# Patient Record
Sex: Male | Born: 1945 | Race: White | Hispanic: No | Marital: Married | Smoking: Former smoker
Health system: Southern US, Community
[De-identification: ages and names within clinical notes are randomized; demographics above are authoritative.]

## PROBLEM LIST (undated history)

## (undated) DIAGNOSIS — M1990 Unspecified osteoarthritis, unspecified site: Secondary | ICD-10-CM

## (undated) DIAGNOSIS — R7303 Prediabetes: Secondary | ICD-10-CM

## (undated) DIAGNOSIS — I2510 Atherosclerotic heart disease of native coronary artery without angina pectoris: Secondary | ICD-10-CM

## (undated) DIAGNOSIS — F32A Depression, unspecified: Secondary | ICD-10-CM

## (undated) DIAGNOSIS — F329 Major depressive disorder, single episode, unspecified: Secondary | ICD-10-CM

## (undated) DIAGNOSIS — K219 Gastro-esophageal reflux disease without esophagitis: Secondary | ICD-10-CM

## (undated) DIAGNOSIS — M797 Fibromyalgia: Secondary | ICD-10-CM

## (undated) DIAGNOSIS — C801 Malignant (primary) neoplasm, unspecified: Secondary | ICD-10-CM

## (undated) DIAGNOSIS — J189 Pneumonia, unspecified organism: Secondary | ICD-10-CM

## (undated) DIAGNOSIS — G4730 Sleep apnea, unspecified: Secondary | ICD-10-CM

## (undated) DIAGNOSIS — I10 Essential (primary) hypertension: Secondary | ICD-10-CM

## (undated) HISTORY — PX: BACK SURGERY: SHX140

## (undated) HISTORY — PX: APPENDECTOMY: SHX54

## (undated) HISTORY — PX: ANAL FISSURE REPAIR: SHX2312

## (undated) HISTORY — PX: TONSILLECTOMY: SUR1361

## (undated) HISTORY — PX: VASECTOMY: SHX75

---

## 2004-11-14 DIAGNOSIS — I2510 Atherosclerotic heart disease of native coronary artery without angina pectoris: Secondary | ICD-10-CM

## 2004-11-14 HISTORY — PX: CORONARY ANGIOPLASTY: SHX604

## 2004-11-14 HISTORY — DX: Atherosclerotic heart disease of native coronary artery without angina pectoris: I25.10

## 2017-03-09 ENCOUNTER — Other Ambulatory Visit (HOSPITAL_COMMUNITY): Payer: Self-pay | Admitting: Neurological Surgery

## 2017-03-09 DIAGNOSIS — M5416 Radiculopathy, lumbar region: Secondary | ICD-10-CM

## 2017-03-14 ENCOUNTER — Ambulatory Visit (HOSPITAL_COMMUNITY)
Admission: RE | Admit: 2017-03-14 | Discharge: 2017-03-14 | Disposition: A | Discharge status: Home / Self Care | Payer: Medicare Other | Source: Ambulatory Visit | Attending: Neurological Surgery | Admitting: Neurological Surgery

## 2017-03-14 DIAGNOSIS — M5127 Other intervertebral disc displacement, lumbosacral region: Secondary | ICD-10-CM | POA: Diagnosis not present

## 2017-03-14 DIAGNOSIS — M4807 Spinal stenosis, lumbosacral region: Secondary | ICD-10-CM | POA: Diagnosis not present

## 2017-03-14 DIAGNOSIS — M5136 Other intervertebral disc degeneration, lumbar region: Secondary | ICD-10-CM | POA: Diagnosis not present

## 2017-03-14 DIAGNOSIS — M5126 Other intervertebral disc displacement, lumbar region: Secondary | ICD-10-CM | POA: Insufficient documentation

## 2017-03-14 DIAGNOSIS — M5416 Radiculopathy, lumbar region: Secondary | ICD-10-CM | POA: Diagnosis not present

## 2017-03-14 DIAGNOSIS — M48061 Spinal stenosis, lumbar region without neurogenic claudication: Secondary | ICD-10-CM | POA: Insufficient documentation

## 2017-03-15 ENCOUNTER — Ambulatory Visit (HOSPITAL_COMMUNITY): Payer: Medicare Other

## 2017-12-15 ENCOUNTER — Other Ambulatory Visit: Payer: Self-pay

## 2017-12-15 ENCOUNTER — Encounter (HOSPITAL_COMMUNITY): Payer: Self-pay | Admitting: *Deleted

## 2017-12-15 NOTE — Progress Notes (Signed)
Spoke with pt for pre-op call. Pt has hx of CAD with one stent placed in 2006. Pt denies any other cardiac history, chest pain or sob. Dr. Rosalita Chessman in [place], [place] is pt's cardiologist. Pt states he has seen Dr. Rosalita Chessman this past year. Have requested most recent EKG tracing, last office visit notes and any cardiac studies. Pt denies being diabetic.

## 2017-12-18 ENCOUNTER — Ambulatory Visit: Payer: Self-pay | Admitting: Ophthalmology

## 2017-12-18 ENCOUNTER — Ambulatory Visit (HOSPITAL_COMMUNITY): Payer: Medicare Other | Admitting: Anesthesiology

## 2017-12-18 ENCOUNTER — Encounter (HOSPITAL_COMMUNITY)
Admission: RE | Disposition: A | Discharge status: Home / Self Care | Payer: Self-pay | Source: Ambulatory Visit | Attending: Ophthalmology

## 2017-12-18 ENCOUNTER — Encounter (HOSPITAL_COMMUNITY): Payer: Self-pay | Admitting: Anesthesiology

## 2017-12-18 ENCOUNTER — Ambulatory Visit (HOSPITAL_COMMUNITY)
Admission: RE | Admit: 2017-12-18 | Discharge: 2017-12-18 | Disposition: A | Discharge status: Home / Self Care | Payer: Medicare Other | Source: Ambulatory Visit | Attending: Ophthalmology | Admitting: Ophthalmology

## 2017-12-18 DIAGNOSIS — Z823 Family history of stroke: Secondary | ICD-10-CM | POA: Insufficient documentation

## 2017-12-18 DIAGNOSIS — M797 Fibromyalgia: Secondary | ICD-10-CM | POA: Insufficient documentation

## 2017-12-18 DIAGNOSIS — Z841 Family history of disorders of kidney and ureter: Secondary | ICD-10-CM | POA: Diagnosis not present

## 2017-12-18 DIAGNOSIS — M1990 Unspecified osteoarthritis, unspecified site: Secondary | ICD-10-CM | POA: Insufficient documentation

## 2017-12-18 DIAGNOSIS — G4730 Sleep apnea, unspecified: Secondary | ICD-10-CM | POA: Diagnosis not present

## 2017-12-18 DIAGNOSIS — K219 Gastro-esophageal reflux disease without esophagitis: Secondary | ICD-10-CM | POA: Insufficient documentation

## 2017-12-18 DIAGNOSIS — H35341 Macular cyst, hole, or pseudohole, right eye: Secondary | ICD-10-CM | POA: Insufficient documentation

## 2017-12-18 DIAGNOSIS — I4510 Unspecified right bundle-branch block: Secondary | ICD-10-CM | POA: Diagnosis not present

## 2017-12-18 DIAGNOSIS — Z87891 Personal history of nicotine dependence: Secondary | ICD-10-CM | POA: Insufficient documentation

## 2017-12-18 DIAGNOSIS — I2510 Atherosclerotic heart disease of native coronary artery without angina pectoris: Secondary | ICD-10-CM | POA: Insufficient documentation

## 2017-12-18 DIAGNOSIS — Z85828 Personal history of other malignant neoplasm of skin: Secondary | ICD-10-CM | POA: Insufficient documentation

## 2017-12-18 DIAGNOSIS — I10 Essential (primary) hypertension: Secondary | ICD-10-CM | POA: Diagnosis not present

## 2017-12-18 DIAGNOSIS — R7303 Prediabetes: Secondary | ICD-10-CM | POA: Insufficient documentation

## 2017-12-18 DIAGNOSIS — F329 Major depressive disorder, single episode, unspecified: Secondary | ICD-10-CM | POA: Diagnosis not present

## 2017-12-18 DIAGNOSIS — Z8249 Family history of ischemic heart disease and other diseases of the circulatory system: Secondary | ICD-10-CM | POA: Insufficient documentation

## 2017-12-18 HISTORY — DX: Pneumonia, unspecified organism: J18.9

## 2017-12-18 HISTORY — DX: Malignant (primary) neoplasm, unspecified: C80.1

## 2017-12-18 HISTORY — DX: Sleep apnea, unspecified: G47.30

## 2017-12-18 HISTORY — PX: SERUM PATCH: SHX6091

## 2017-12-18 HISTORY — DX: Atherosclerotic heart disease of native coronary artery without angina pectoris: I25.10

## 2017-12-18 HISTORY — PX: GAS INSERTION: SHX5336

## 2017-12-18 HISTORY — PX: MEMBRANE PEEL: SHX5967

## 2017-12-18 HISTORY — PX: PARS PLANA VITRECTOMY W/ SCLERAL BUCKLE: SHX2171

## 2017-12-18 HISTORY — DX: Major depressive disorder, single episode, unspecified: F32.9

## 2017-12-18 HISTORY — DX: Gastro-esophageal reflux disease without esophagitis: K21.9

## 2017-12-18 HISTORY — DX: Fibromyalgia: M79.7

## 2017-12-18 HISTORY — DX: Depression, unspecified: F32.A

## 2017-12-18 HISTORY — DX: Unspecified osteoarthritis, unspecified site: M19.90

## 2017-12-18 HISTORY — PX: GAS/FLUID EXCHANGE: SHX5334

## 2017-12-18 HISTORY — DX: Prediabetes: R73.03

## 2017-12-18 HISTORY — DX: Essential (primary) hypertension: I10

## 2017-12-18 LAB — BASIC METABOLIC PANEL
ANION GAP: 14 (ref 5–15)
BUN: 11 mg/dL (ref 6–20)
CHLORIDE: 98 mmol/L — AB (ref 101–111)
CO2: 25 mmol/L (ref 22–32)
CREATININE: 1.11 mg/dL (ref 0.61–1.24)
Calcium: 8.6 mg/dL — ABNORMAL LOW (ref 8.9–10.3)
GFR calc non Af Amer: >60 mL/min (ref >60)
Glucose, Bld: 97 mg/dL (ref 65–99)
Potassium: 3.2 mmol/L — ABNORMAL LOW (ref 3.5–5.1)
SODIUM: 137 mmol/L (ref 135–145)

## 2017-12-18 LAB — CBC
HEMATOCRIT: 38.5 % — AB (ref 39.0–52.0)
HEMOGLOBIN: 13.1 g/dL (ref 13.0–17.0)
MCH: 28.9 pg (ref 26.0–34.0)
MCHC: 34 g/dL (ref 30.0–36.0)
MCV: 84.8 fL (ref 78.0–100.0)
Platelets: 205 10*3/uL (ref 150–400)
RBC: 4.54 MIL/uL (ref 4.22–5.81)
RDW: 13.8 % (ref 11.5–15.5)
WBC: 7.2 10*3/uL (ref 4.0–10.5)

## 2017-12-18 LAB — AUTOLOGOUS SERUM PATCH PREP

## 2017-12-18 SURGERY — PARS PLANA VITRECTOMY WITH LASER FOR MACULAR HOLE
Anesthesia: Monitor Anesthesia Care | Site: Eye | Laterality: Right

## 2017-12-18 MED ORDER — DEXAMETHASONE SODIUM PHOSPHATE 10 MG/ML IJ SOLN
INTRAMUSCULAR | Status: DC | PRN
  Administered 2017-12-18: 10 mg

## 2017-12-18 MED ORDER — PROPOFOL 10 MG/ML IV BOLUS
INTRAVENOUS | Status: DC | PRN
  Administered 2017-12-18: 30 mg via INTRAVENOUS

## 2017-12-18 MED ORDER — HYALURONIDASE HUMAN 150 UNIT/ML IJ SOLN
INTRAMUSCULAR | Status: AC
  Filled 2017-12-18: qty 1

## 2017-12-18 MED ORDER — MIDAZOLAM HCL 2 MG/2ML IJ SOLN
INTRAMUSCULAR | Status: AC
  Filled 2017-12-18: qty 2

## 2017-12-18 MED ORDER — FENTANYL CITRATE (PF) 100 MCG/2ML IJ SOLN
INTRAMUSCULAR | Status: DC | PRN
  Administered 2017-12-18: 50 ug via INTRAVENOUS

## 2017-12-18 MED ORDER — HYDROMORPHONE HCL 1 MG/ML IJ SOLN: 0.2500 mg | INTRAMUSCULAR | Status: DC | PRN

## 2017-12-18 MED ORDER — HYPROMELLOSE (GONIOSCOPIC) 2.5 % OP SOLN
OPHTHALMIC | Status: AC
  Filled 2017-12-18: qty 15

## 2017-12-18 MED ORDER — OFLOXACIN 0.3 % OP SOLN
1.0000 [drp] | OPHTHALMIC | Status: AC | PRN
  Administered 2017-12-18 (×3): 1 [drp] via OPHTHALMIC

## 2017-12-18 MED ORDER — CYCLOPENTOLATE HCL 1 % OP SOLN
1.0000 [drp] | OPHTHALMIC | Status: AC | PRN
  Administered 2017-12-18 (×3): 1 [drp] via OPHTHALMIC

## 2017-12-18 MED ORDER — LIDOCAINE HCL 2 % IJ SOLN
INTRAMUSCULAR | Status: DC | PRN
  Administered 2017-12-18: 6 mL via RETROBULBAR

## 2017-12-18 MED ORDER — EPINEPHRINE PF 1 MG/ML IJ SOLN
INTRAOCULAR | Status: DC | PRN
  Administered 2017-12-18: 13:00:00

## 2017-12-18 MED ORDER — EPINEPHRINE PF 1 MG/ML IJ SOLN
INTRAMUSCULAR | Status: AC
  Filled 2017-12-18: qty 1

## 2017-12-18 MED ORDER — ROCURONIUM BROMIDE 10 MG/ML (PF) SYRINGE
PREFILLED_SYRINGE | INTRAVENOUS | Status: AC
  Filled 2017-12-18: qty 5

## 2017-12-18 MED ORDER — ONDANSETRON HCL 4 MG/2ML IJ SOLN: 4.0000 mg | Freq: Once | INTRAMUSCULAR | Status: DC | PRN

## 2017-12-18 MED ORDER — BSS PLUS IO SOLN
INTRAOCULAR | Status: AC
  Filled 2017-12-18: qty 500

## 2017-12-18 MED ORDER — MIDAZOLAM HCL 5 MG/5ML IJ SOLN
INTRAMUSCULAR | Status: DC | PRN
  Administered 2017-12-18: 2 mg via INTRAVENOUS

## 2017-12-18 MED ORDER — DEXAMETHASONE SODIUM PHOSPHATE 10 MG/ML IJ SOLN
INTRAMUSCULAR | Status: AC
  Filled 2017-12-18: qty 1

## 2017-12-18 MED ORDER — LIDOCAINE HCL 2 % IJ SOLN
INTRAMUSCULAR | Status: AC
  Filled 2017-12-18: qty 20

## 2017-12-18 MED ORDER — TROPICAMIDE 1 % OP SOLN
1.0000 [drp] | OPHTHALMIC | Status: AC | PRN
  Administered 2017-12-18 (×3): 1 [drp] via OPHTHALMIC

## 2017-12-18 MED ORDER — TOBRAMYCIN-DEXAMETHASONE 0.3-0.1 % OP SUSP: OPHTHALMIC | Status: DC | PRN

## 2017-12-18 MED ORDER — CEFAZOLIN SUBCONJUNCTIVAL INJECTION 100 MG/0.5 ML
200.0000 mg | INJECTION | SUBCONJUNCTIVAL | Status: AC
  Administered 2017-12-18: 200 mg via SUBCONJUNCTIVAL
  Filled 2017-12-18: qty 5

## 2017-12-18 MED ORDER — LIDOCAINE 2% (20 MG/ML) 5 ML SYRINGE
INTRAMUSCULAR | Status: DC | PRN
  Administered 2017-12-18: 60 mg via INTRAVENOUS

## 2017-12-18 MED ORDER — TOBRAMYCIN 0.3 % OP OINT
TOPICAL_OINTMENT | OPHTHALMIC | Status: DC | PRN
  Administered 2017-12-18: 1 via OPHTHALMIC

## 2017-12-18 MED ORDER — LIDOCAINE 2% (20 MG/ML) 5 ML SYRINGE
INTRAMUSCULAR | Status: AC
  Filled 2017-12-18: qty 5

## 2017-12-18 MED ORDER — TOBRAMYCIN-DEXAMETHASONE 0.3-0.1 % OP OINT
TOPICAL_OINTMENT | OPHTHALMIC | Status: AC
  Filled 2017-12-18: qty 3.5

## 2017-12-18 MED ORDER — FENTANYL CITRATE (PF) 250 MCG/5ML IJ SOLN
INTRAMUSCULAR | Status: AC
  Filled 2017-12-18: qty 5

## 2017-12-18 MED ORDER — INDOCYANINE GREEN 25 MG IV SOLR
0.2500 mg | Freq: Once | INTRAVENOUS | Status: AC
  Administered 2017-12-18: .25 mg via TOPICAL
  Filled 2017-12-18: qty 25

## 2017-12-18 MED ORDER — TETRACAINE HCL 0.5 % OP SOLN
OPHTHALMIC | Status: AC
  Filled 2017-12-18: qty 4

## 2017-12-18 MED ORDER — 0.9 % SODIUM CHLORIDE (POUR BTL) OPTIME
TOPICAL | Status: DC | PRN
  Administered 2017-12-18: 1000 mL

## 2017-12-18 MED ORDER — BUPIVACAINE HCL (PF) 0.75 % IJ SOLN
INTRAMUSCULAR | Status: AC
  Filled 2017-12-18: qty 10

## 2017-12-18 MED ORDER — HYPROMELLOSE (GONIOSCOPIC) 2.5 % OP SOLN
OPHTHALMIC | Status: DC | PRN
  Administered 2017-12-18: 2 [drp] via OPHTHALMIC

## 2017-12-18 MED ORDER — SODIUM CHLORIDE 0.9 % IJ SOLN
INTRAMUSCULAR | Status: AC
  Filled 2017-12-18: qty 10

## 2017-12-18 MED ORDER — STERILE WATER FOR IRRIGATION IR SOLN
Status: DC | PRN
  Administered 2017-12-18: 200 mL

## 2017-12-18 MED ORDER — OFLOXACIN 0.3 % OP SOLN
OPHTHALMIC | Status: AC
  Administered 2017-12-18: 1 [drp] via OPHTHALMIC
  Filled 2017-12-18: qty 5

## 2017-12-18 MED ORDER — ONDANSETRON HCL 4 MG/2ML IJ SOLN
INTRAMUSCULAR | Status: DC | PRN
  Administered 2017-12-18: 4 mg via INTRAVENOUS

## 2017-12-18 MED ORDER — ONDANSETRON HCL 4 MG/2ML IJ SOLN
INTRAMUSCULAR | Status: AC
  Filled 2017-12-18: qty 2

## 2017-12-18 MED ORDER — TROPICAMIDE 1 % OP SOLN
OPHTHALMIC | Status: AC
  Administered 2017-12-18: 1 [drp] via OPHTHALMIC
  Filled 2017-12-18: qty 15

## 2017-12-18 MED ORDER — ATROPINE SULFATE 1 % OP SOLN
OPHTHALMIC | Status: AC
  Filled 2017-12-18: qty 5

## 2017-12-18 MED ORDER — MEPERIDINE HCL 25 MG/ML IJ SOLN: 6.2500 mg | INTRAMUSCULAR | Status: DC | PRN

## 2017-12-18 MED ORDER — CYCLOPENTOLATE HCL 1 % OP SOLN
OPHTHALMIC | Status: AC
  Administered 2017-12-18: 1 [drp] via OPHTHALMIC
  Filled 2017-12-18: qty 2

## 2017-12-18 MED ORDER — SODIUM CHLORIDE 0.9 % IV SOLN
INTRAVENOUS | Status: DC
  Administered 2017-12-18: 10:00:00 via INTRAVENOUS

## 2017-12-18 MED ORDER — BSS IO SOLN
INTRAOCULAR | Status: AC
  Filled 2017-12-18: qty 15

## 2017-12-18 SURGICAL SUPPLY — 73 items
APPLICATOR COTTON TIP 6IN STRL (MISCELLANEOUS) ×6 IMPLANT
APPLICATOR DR MATTHEWS STRL (MISCELLANEOUS) ×18 IMPLANT
BANDAGE EYE OVAL (MISCELLANEOUS) IMPLANT
BLADE EYE CATARACT 19 1.4 BEAV (BLADE) IMPLANT
BLADE MVR KNIFE 19G (BLADE) IMPLANT
CANNULA ANT CHAM MAIN (OPHTHALMIC RELATED) IMPLANT
CANNULA DUAL BORE 23G (CANNULA) IMPLANT
CANNULA VLV SOFT TIP 25GA (OPHTHALMIC) ×9 IMPLANT
CAUTERY EYE LOW TEMP 1300F FIN (OPHTHALMIC RELATED) IMPLANT
CLOSURE WOUND 1/2 X4 (GAUZE/BANDAGES/DRESSINGS) ×1
COTTONBALL LRG STERILE PKG (GAUZE/BANDAGES/DRESSINGS) ×15 IMPLANT
COVER SURGICAL LIGHT HANDLE (MISCELLANEOUS) ×3 IMPLANT
DRAPE INCISE 51X51 W/FILM STRL (DRAPES) ×6 IMPLANT
DRAPE RETRACTOR (MISCELLANEOUS) ×3 IMPLANT
ERASER HMR WETFIELD 23G BP (MISCELLANEOUS) IMPLANT
FILTER BLUE MILLIPORE (MISCELLANEOUS) IMPLANT
FILTER STRAW FLUID ASPIR (MISCELLANEOUS) IMPLANT
FORCEPS GRIESHABER ILM 25G A (INSTRUMENTS) IMPLANT
GAS AUTO FILL CONSTEL (OPHTHALMIC) ×3
GAS AUTO FILL CONSTELLATION (OPHTHALMIC) ×1 IMPLANT
GAS OPHTHALMIC (MISCELLANEOUS) ×3 IMPLANT
GLOVE BIOGEL PI IND STRL 8 (GLOVE) ×1 IMPLANT
GLOVE BIOGEL PI INDICATOR 8 (GLOVE) ×2
GLOVE ECLIPSE 7.5 STRL STRAW (GLOVE) ×6 IMPLANT
GLOVE SURG SS PI 6.0 STRL IVOR (GLOVE) ×6 IMPLANT
GLOVE SURG SS PI 6.5 STRL IVOR (GLOVE) ×3 IMPLANT
GOWN STRL REUS W/ TWL LRG LVL3 (GOWN DISPOSABLE) ×2 IMPLANT
GOWN STRL REUS W/TWL LRG LVL3 (GOWN DISPOSABLE) ×4
HANDLE PNEUMATIC FOR CONSTEL (OPHTHALMIC) IMPLANT
KIT BASIN OR (CUSTOM PROCEDURE TRAY) ×3 IMPLANT
KIT PERFLUORON PROCEDURE 5ML (MISCELLANEOUS) IMPLANT
KIT ROOM TURNOVER OR (KITS) ×3 IMPLANT
KNIFE CRESCENT 1.75 EDGEAHEAD (BLADE) IMPLANT
KNIFE GRIESHABER SHARP 2.5MM (MISCELLANEOUS) ×6 IMPLANT
LENS BIOM SUPER VIEW SET DISP (OPHTHALMIC RELATED) ×6 IMPLANT
MICROPICK 25G (MISCELLANEOUS)
NEEDLE 18GX1X1/2 (RX/OR ONLY) (NEEDLE) ×9 IMPLANT
NEEDLE 25GX 5/8IN NON SAFETY (NEEDLE) ×3 IMPLANT
NEEDLE FILTER BLUNT 18X 1/2SAF (NEEDLE) ×6
NEEDLE FILTER BLUNT 18X1 1/2 (NEEDLE) ×3 IMPLANT
NEEDLE HYPO 25GX1X1/2 BEV (NEEDLE) ×6 IMPLANT
NEEDLE HYPO 30X.5 LL (NEEDLE) ×3 IMPLANT
NEEDLE RETROBULBAR 25GX1.5 (NEEDLE) ×3 IMPLANT
NS IRRIG 1000ML POUR BTL (IV SOLUTION) ×3 IMPLANT
PACK VITRECTOMY CUSTOM (CUSTOM PROCEDURE TRAY) ×3 IMPLANT
PAD ARMBOARD 7.5X6 YLW CONV (MISCELLANEOUS) ×6 IMPLANT
PAK PIK VITRECTOMY CVS 25GA (OPHTHALMIC) ×3 IMPLANT
PICK MICROPICK 25G (MISCELLANEOUS) IMPLANT
PROBE LASER ILLUM FLEX CVD 25G (OPHTHALMIC) IMPLANT
REPL STRA BRUSH NEEDLE (NEEDLE) IMPLANT
RESERVOIR BACK FLUSH (MISCELLANEOUS) IMPLANT
ROLLS DENTAL (MISCELLANEOUS) ×6 IMPLANT
SCRAPER DIAMOND 25GA (OPHTHALMIC RELATED) IMPLANT
SCRAPER DIAMOND DUST MEMBRANE (MISCELLANEOUS) ×3 IMPLANT
SHEET MEDIUM DRAPE 40X70 STRL (DRAPES) ×6 IMPLANT
SOLUTION ANTI FOG 6CC (MISCELLANEOUS) ×6 IMPLANT
SPONGE SURGIFOAM ABS GEL 12-7 (HEMOSTASIS) IMPLANT
STOPCOCK 4 WAY LG BORE MALE ST (IV SETS) IMPLANT
STRIP CLOSURE SKIN 1/2X4 (GAUZE/BANDAGES/DRESSINGS) ×2 IMPLANT
SUT CHROMIC 7 0 TG140 8 (SUTURE) IMPLANT
SUT ETHILON 9 0 TG140 8 (SUTURE) IMPLANT
SUT MERSILENE 4 0 RV 2 (SUTURE) IMPLANT
SUT SILK 2 0 (SUTURE)
SUT SILK 2-0 18XBRD TIE 12 (SUTURE) IMPLANT
SUT SILK 4 0 RB 1 (SUTURE) IMPLANT
SYR 10ML LL (SYRINGE) ×9 IMPLANT
SYR 20CC LL (SYRINGE) IMPLANT
SYR 5ML LL (SYRINGE) ×6 IMPLANT
SYR BULB 3OZ (MISCELLANEOUS) ×6 IMPLANT
SYR TB 1ML LUER SLIP (SYRINGE) ×3 IMPLANT
TUBING HIGH PRESS EXTEN 6IN (TUBING) IMPLANT
WATER STERILE IRR 1000ML POUR (IV SOLUTION) ×3 IMPLANT
WIPE INSTRUMENT VISIWIPE 73X73 (MISCELLANEOUS) IMPLANT

## 2017-12-18 NOTE — H&P (Deleted)
Date of examination:  12/05/17  Indication for surgery: non-clearing vitreous hemorrhage right eye  Pertinent past medical history:  Past Medical History:  Diagnosis Date  . Arthritis   . Cancer ([place])    basal cell left eyelid (lower)  . Coronary artery disease 2006   1 stent   . Depression    after death of first wife  . Fibromyalgia   . GERD (gastroesophageal reflux disease)   . Hypertension   . Pneumonia    as a child  . Pre-diabetes   . Sleep apnea    uses cpap    Pertinent ocular history:  Vitreous hemorrhage right eye  Pertinent family history:  Family History  Problem Relation Age of Onset  . CAD Mother   . Stroke Mother   . Kidney disease Father   . Heart attack Father     General:  Healthy appearing patient in no distress.    Eyes:    Acuity OD 20/60  [place]  External: Within normal limits     Anterior segment: Within normal limits       Fundus: vitreous hemorrhage right eye     Impression: Vitreous hemorrhage right eye  Plan: Pars plana vitrectomy with endolaser right eye  Michael Brandt

## 2017-12-18 NOTE — H&P (Signed)
Date of examination:  12/18/17  Indication for surgery: macular hole right eye  Pertinent past medical history:  Past Medical History:  Diagnosis Date  . Arthritis   . Cancer ([place])    basal cell left eyelid (lower)  . Coronary artery disease 2006   1 stent   . Depression    after death of first wife  . Fibromyalgia   . GERD (gastroesophageal reflux disease)   . Hypertension   . Pneumonia    as a child  . Pre-diabetes   . Sleep apnea    uses cpap    Pertinent ocular history:  Macular hole right eye Pertinent family history:  Family History  Problem Relation Age of Onset  . CAD Mother   . Stroke Mother   . Kidney disease Father   . Heart attack Father     General:  Healthy appearing patient in no distress.    Eyes:    Acuity OD 20/400   External: Within normal limits     Anterior segment: Within normal limits     Fundus: macular hole right eye   Impression: macular hole right eye  Plan: pars plana vitrectomy with internal limiting membrane peel right eye  Royston Cowper

## 2017-12-18 NOTE — Discharge Instructions (Signed)
DO NOT SLEEP ON BACK, THE EYE PRESSURE CAN GO UP AND CAUSE VISION LOSS   SLEEP ON SIDE WITH NOSE TO PILLOW  DURING DAY KEEP FACE DOWN.  15 MINUTES EVERY 2 HOURS IT IS OK TO LOOK STRAIGHT AHEAD (USE BATHROOM, EAT, WALK, ETC.)  

## 2017-12-18 NOTE — Transfer of Care (Signed)
Immediate Anesthesia Transfer of Care Note  Patient: Talis Iwan  Procedure(s) Performed: PARS PLANA VITRECTOMY WITH INTERNAL LENS MEMBRANE PEEL FOR MACULAR HOLE (Right )  Patient Location: PACU  Anesthesia Type:MAC  Level of Consciousness: awake and alert   Airway & Oxygen Therapy: Patient Spontanous Breathing and Patient connected to nasal cannula oxygen  Post-op Assessment: Report given to RN and Post -op Vital signs reviewed and stable  Post vital signs: Reviewed and stable  Last Vitals:  Vitals:   12/18/17 0930  BP: (!) 142/73  Pulse: (!) 57  Resp: 18  Temp: 36.6 C  SpO2: 100%    Last Pain:  Vitals:   12/18/17 0930  TempSrc: Oral      Patients Stated Pain Goal: 5 (23/53/61 [id number])  Complications: No apparent anesthesia complications

## 2017-12-18 NOTE — Anesthesia Procedure Notes (Signed)
Procedure Name: MAC Date/Time: 12/18/2017 12:52 PM Performed by: Lieutenant Diego, CRNA Pre-anesthesia Checklist: Patient identified, Emergency Drugs available, Suction available, Patient being monitored and Timeout performed Patient Re-evaluated:Patient Re-evaluated prior to induction Oxygen Delivery Method: Nasal cannula Preoxygenation: Pre-oxygenation with 100% oxygen Induction Type: IV induction

## 2017-12-18 NOTE — Anesthesia Preprocedure Evaluation (Signed)
Anesthesia Evaluation  Patient identified by MRN, date of birth, ID band Patient awake    Reviewed: Allergy & Precautions, NPO status , Patient's Chart, lab work & pertinent test results  Airway Mallampati: I  TM Distance: >3 FB Neck ROM: Full    Dental   Pulmonary sleep apnea , former smoker,    Pulmonary exam normal        Cardiovascular hypertension, Pt. on medications + CAD and + Cardiac Stents  Normal cardiovascular exam     Neuro/Psych Depression    GI/Hepatic GERD  Medicated and Controlled,  Endo/Other    Renal/GU      Musculoskeletal   Abdominal   Peds  Hematology   Anesthesia Other Findings   Reproductive/Obstetrics                             Anesthesia Physical Anesthesia Plan  ASA: III  Anesthesia Plan: MAC   Post-op Pain Management:    Induction: Intravenous  PONV Risk Score and Plan: 1 and Treatment may vary due to age or medical condition  Airway Management Planned: Simple Face Mask  Additional Equipment:   Intra-op Plan:   Post-operative Plan:   Informed Consent: I have reviewed the patients History and Physical, chart, labs and discussed the procedure including the risks, benefits and alternatives for the proposed anesthesia with the patient or authorized representative who has indicated his/her understanding and acceptance.     Plan Discussed with: CRNA and Surgeon  Anesthesia Plan Comments:         Anesthesia Quick Evaluation

## 2017-12-18 NOTE — Anesthesia Postprocedure Evaluation (Signed)
Anesthesia Post Note  Patient: Michael Brandt  Procedure(s) Performed: PARS PLANA VITRECTOMY WITH MACULAR HOLE RIGHT EYE (Right Eye) SERUM PATCH RIGHT EYE (Right Eye) MEMBRANE PEEL RIGHT EYE (Right Eye) GAS/FLUID EXCHANGE RIGHT EYE (Right Eye) INSERTION OF GAS RIGHT EYE (Right Eye)     Patient location during evaluation: PACU Anesthesia Type: MAC Level of consciousness: awake and alert Pain management: pain level controlled Vital Signs Assessment: post-procedure vital signs reviewed and stable Respiratory status: spontaneous breathing, nonlabored ventilation, respiratory function stable and patient connected to nasal cannula oxygen Cardiovascular status: stable and blood pressure returned to baseline Postop Assessment: no apparent nausea or vomiting Anesthetic complications: no    Last Vitals:  Vitals:   12/18/17 0930 12/18/17 1400  BP: (!) 142/73   Pulse: (!) 57   Resp: 18   Temp: 36.6 C 36.5 C  SpO2: 100%     Last Pain:  Vitals:   12/18/17 0930  TempSrc: Oral                 Bexlee Bergdoll DAVID

## 2017-12-18 NOTE — Brief Op Note (Signed)
12/18/2017  2:49 PM  PATIENT:  Michael Brandt  72 y.o. male  PRE-OPERATIVE DIAGNOSIS:  macular hole RIGHT  POST-OPERATIVE DIAGNOSIS:  macular hole RIGHT eye  PROCEDURE:  Procedure(s): PARS PLANA VITRECTOMY WITH MACULAR HOLE REPAIR RIGHT EYE (Right) SERUM PATCH RIGHT EYE (Right) INTERNAL LIMITING MEMBRANE PEEL RIGHT EYE (Right) GAS/FLUID EXCHANGE RIGHT EYE (Right) INSERTION OF GAS RIGHT EYE (Right)  SURGEON:  Surgeon(s) and Role:    * Jalene Mullet, MD - Primary  PHYSICIAN ASSISTANT:   ASSISTANTS: NONE  ANESTHESIA:   MAC, LOCAL  EBL:  3 mL   BLOOD ADMINISTERED:none  DRAINS: none   LOCAL MEDICATIONS USED:  MARCAINE    and LIDOCAINE   SPECIMEN:  No Specimen  DISPOSITION OF SPECIMEN:  N/A  COUNTS:  YES  TOURNIQUET:  * No tourniquets in log *  DICTATION: .Note written in EPIC  PLAN OF CARE: Discharge to home after PACU  PATIENT DISPOSITION:  PACU - hemodynamically stable.   Delay start of Pharmacological VTE agent (>24hrs) due to surgical blood loss or risk of bleeding: not applicable

## 2017-12-19 ENCOUNTER — Encounter (HOSPITAL_COMMUNITY): Payer: Self-pay | Admitting: Ophthalmology

## 2017-12-19 NOTE — Op Note (Signed)
Nichalas Coin 12/19/2017 Diagnosis: macular hole right eye  Procedure: Pars Plana Vitrectomy, Membrane Peeling, Fluid Gas Exchange and plasma patch Operative Eye:  right eye  Surgeon: Royston Cowper Estimated Blood Loss: minimal Specimens for Pathology:  None Complications: none   The  patient was prepped and draped in the usual fashion for ocular surgery on the  right eye .  A lid speculum was placed.  Infusion line and trocar was placed at the 8 o'clock position approximately 3.5 mm from the surgical limbus.   The infusion line was allowed to run and then clamped when placed at the cannula opening. The line was inserted and secured to the drape with an adhesive strip.   Active trocars/cannula were placed at the 10 and 2 o'clock positions approximately 3.5 mm from the surgical limbus. The cannula was visualized in the vitreous cavity.  The light pipe and vitreous cutter were inserted into the vitreous cavity and a core vitrectomy was performed.  Care taken to remove the vitreous up to the vitreous base for 360 degrees.   ICG dye was injected in the eye and the vitreous face highlighted.  Additional vitrectomy with elevation of the posterior hyaloid was performed.  ICG dye was injected over the macula and left in place for 20 seconds and irrigated free.  The tano scratcher was used to elevate the ILM and the ILM forceps were used to carefully remove the ILM from the macula.  A small flap of ILM was placed over the macular hole.  A complete air/fluid exchange was performed.  One drop of plasma was placed directly over the macular hole.  The superonasal trocar was removed.  14% C3F8 gas was placed in the eye and the remaining trocars were removed.  The sclerotomies were noted to be airtight.  Subconjunctival injections of  Dexamethasone 4mg /82ml was placed in the infero-medial quadrant.   The speculum and drapes were removed and the eye was patched with Polymixin/Bacitracin ophthalmic ointment.  An eye shield was placed and the patient was transferred alert and conversant with stable vital signs to the post operative recovery area.  The patient tolerated the procedure well and no complications were noted.  Royston Cowper MD

## 2018-05-30 IMAGING — MR MR LUMBAR SPINE W/O CM
4 of 5 series · 13 of 48 positions shown · non-contrast
Comparison: Lumbar radiographs 02/18/2013. Outside lumbar MRI
07/28/2011

CLINICAL DATA: 69-year-old male with progressed chronic lumbar back
pain radiating to the right lower extremity with weakness and
numbness. Symptom progression for the past 6 months.

EXAM:
MRI LUMBAR SPINE WITHOUT CONTRAST
TECHNIQUE: Multiplanar, multisequence MR imaging of the lumbar spine was
performed. No intravenous contrast was administered.

[Series 3: T2 · sagittal · 4.0mm · 0.77mm/px · 4 of 17 slices shown (1 of 2)]
[im 1/17]
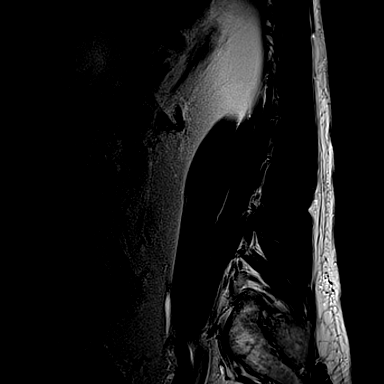
[im 4/17]
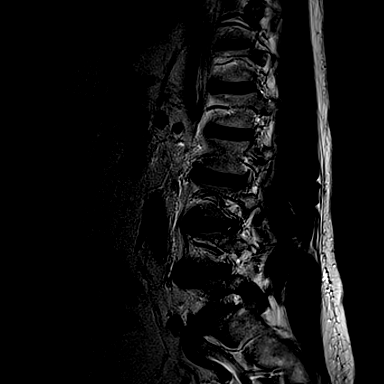
[im 10/17]
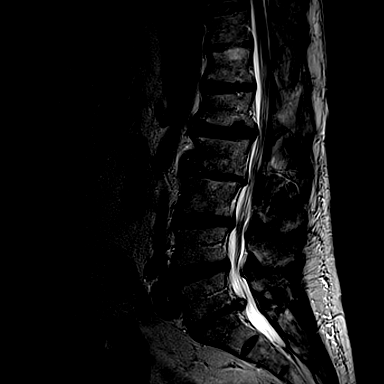
[im 17/17]
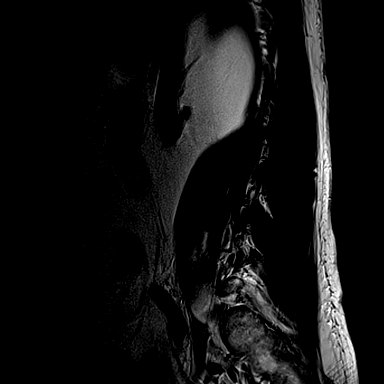

[Series 4: T1 · sagittal · 4.0mm · 0.37mm/px · 3 of 15 slices shown (1 of 2)]
[im 3/15]
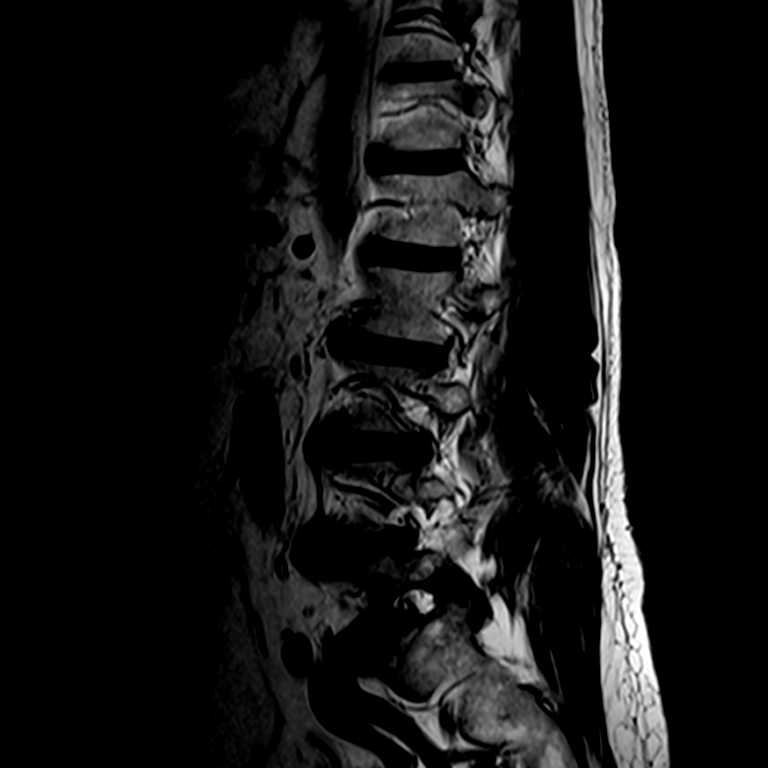
[im 9/15]
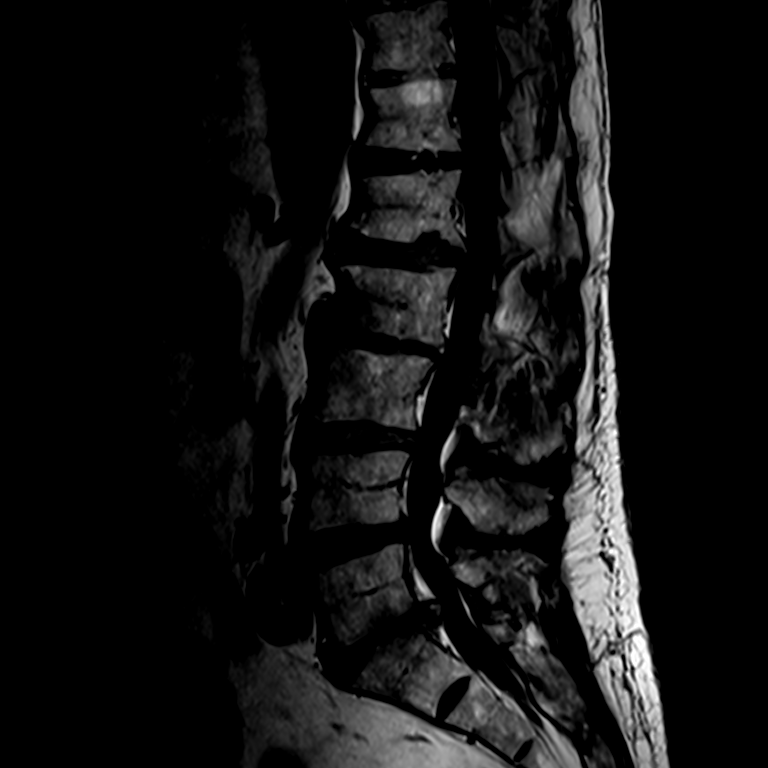
[im 15/15]
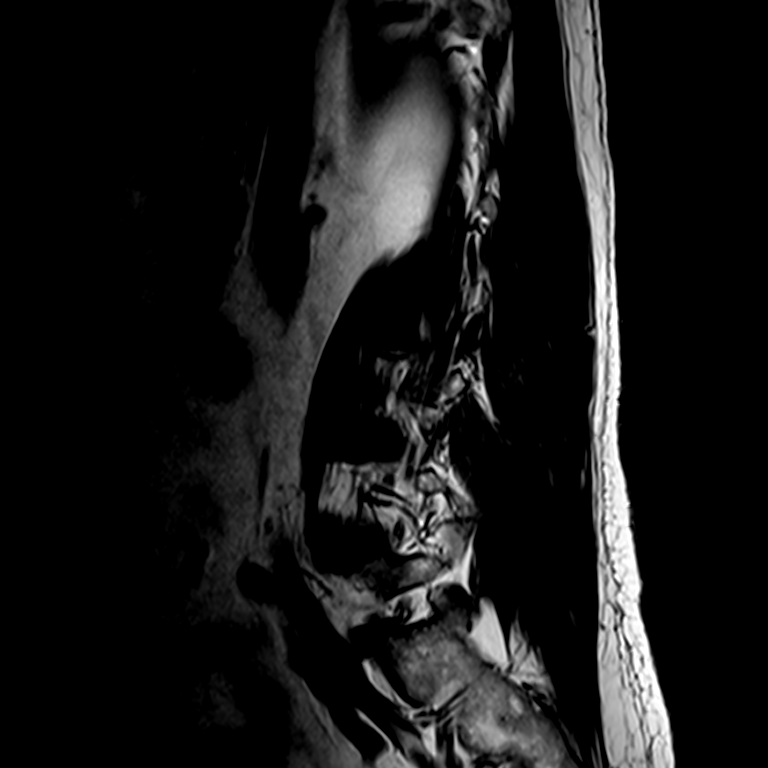

[Series 6: T2 · axial · 4.0mm · 0.21mm/px · z∈[-116,+24]mm · 3 of 40 slices shown (2 of 2)]
[im 6/40]
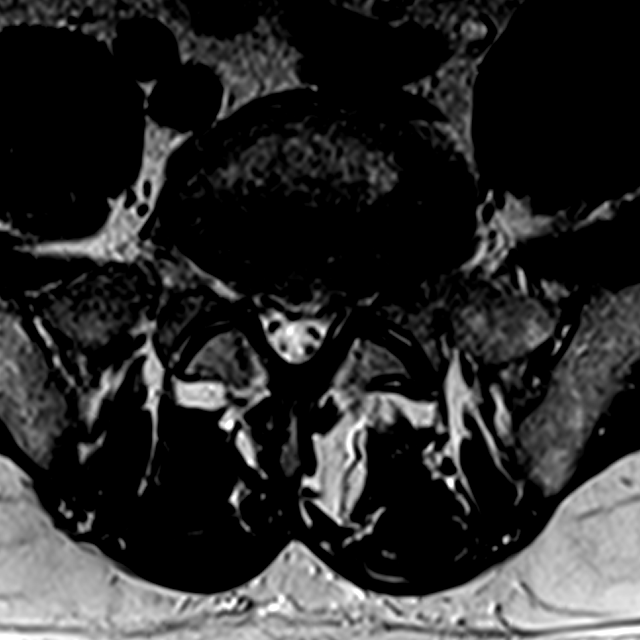
[im 20/40]
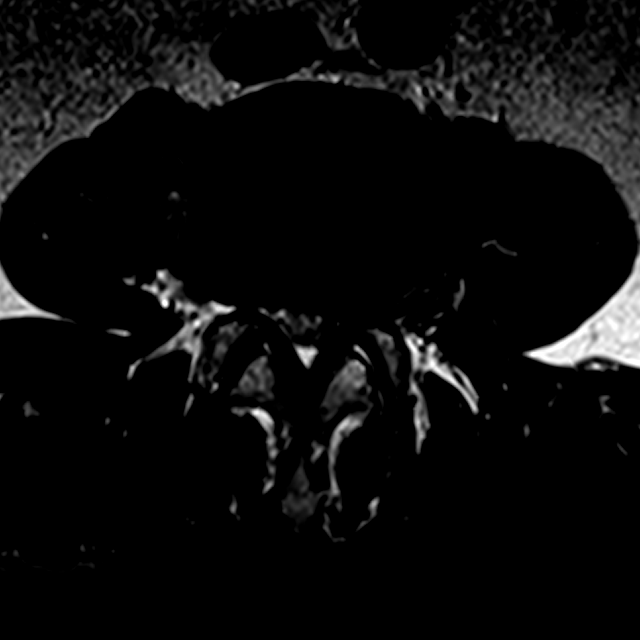
[im 34/40]
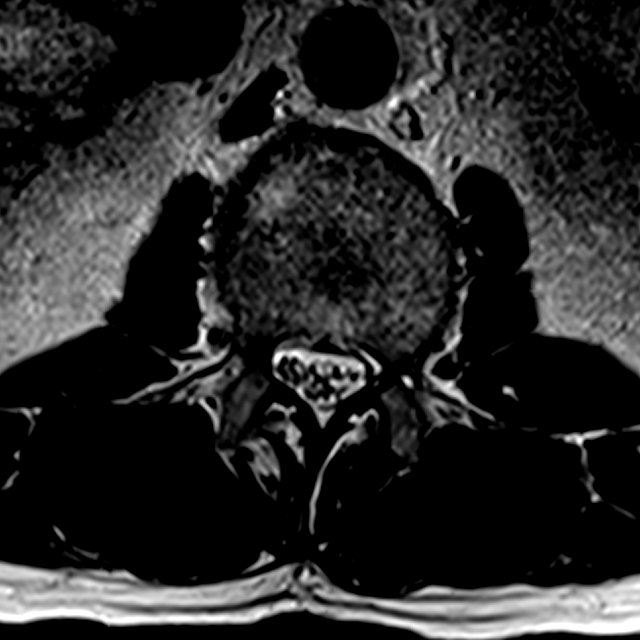

[Series 7: T1 · axial · 4.0mm · 0.22mm/px · z∈[-116,+25]mm · 3 of 40 slices shown (2 of 2)]
[im 6/40]
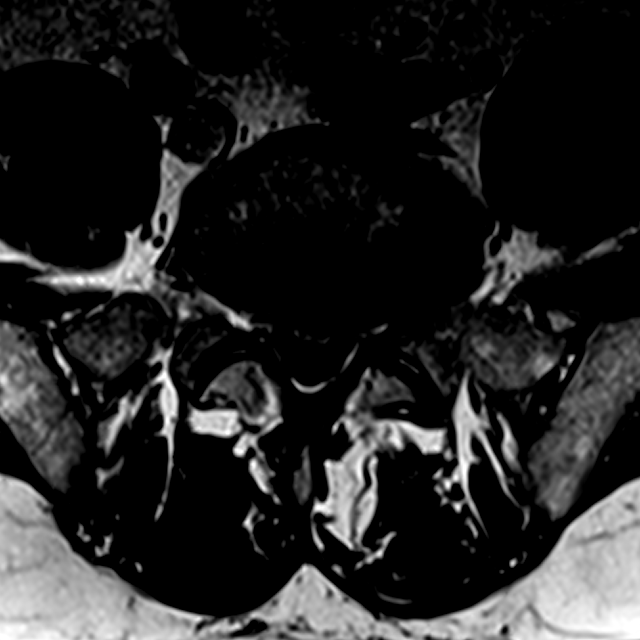
[im 20/40]
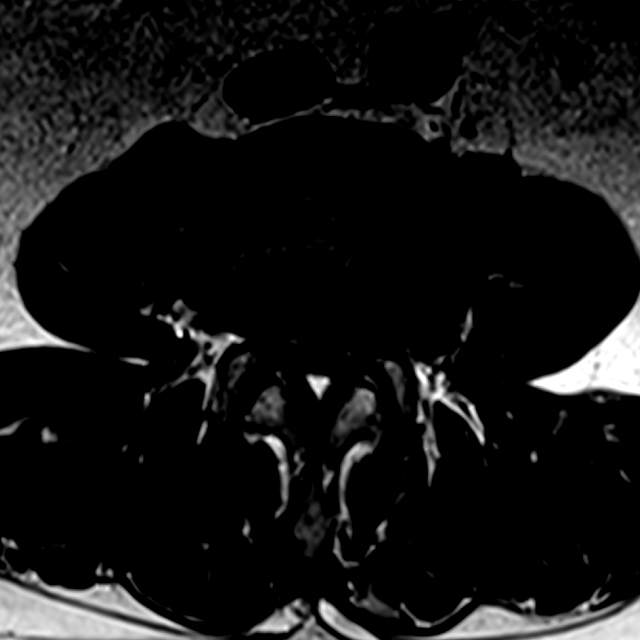
[im 34/40]
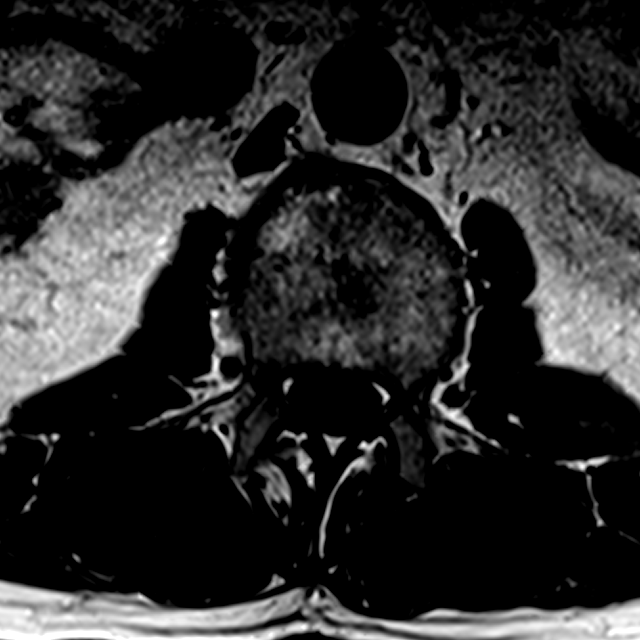

[13 of 48 positions shown; findings below may reference images not displayed]

FINDINGS: Segmentation:  Normal as demonstrated on the comparison radiographs.

Alignment: Largely stable vertebral height and alignment since 8438.
Mild retrolisthesis of L5 on S1 appears increased. Subtle
retrolisthesis of L2 on L3 is more apparent.

Vertebrae: Mild posterior midline degenerative endplate marrow edema
at L5-S1. Mild degenerative anterior endplate marrow edema at L2-L3.
Otherwise normal bone marrow signal. No other acute osseous
abnormality identified.

Conus medullaris: Extends to the T12 level and appears normal.

Paraspinal and other soft tissues: Negative.

Disc levels:

T10-T11:  Partially visible, no spinal stenosis.

T11-T12: Interbody ankylosis.  No stenosis.

T12-L1:  Negative.

L1-L2: Stable mild disc bulge with broad-based posterior component.
Mild endplate spurring greater on the right. Stable borderline to
mild spinal stenosis (series 6, image 5).

L2-L3: Chronic disc space loss and circumferential disc osteophyte
complex with broad-based posterior component. Prior postoperative
changes here on the left, stable since 8438. No spinal or lateral
recess stenosis. A broad-based right foraminal disc protrusion has
regressed since 8438. Otherwise stable mild to moderate left greater
than right L2 foraminal stenosis.

L3-L4: Chronic disc desiccation and left eccentric circumferential
disc bulge has progressed since 8438 with broad-based posterior
component. Mild facet and ligament flavum hypertrophy is stable to
mildly progressed. Increased moderate to severe spinal stenosis and
moderate left lateral recess stenosis (series 6, image 21).
Increased moderate left L3 foraminal stenosis primarily due to
broad-based disc.

L4-L5: Chronic circumferential disc bulge eccentric to the left,
with progressed broad-based left foraminal component of disc since
8438 very image 27). Moderate facet and mild ligament flavum
hypertrophy are mildly progressed. Increase trace right facet joint
fluid. Increased mild to moderate spinal stenosis with mild left
lateral recess stenosis since 8438. New moderate left
foraminal/extraforaminal stenosis.

L5-S1: Chronic disc space loss and vacuum disc. Chronic
circumferential disc bulge and endplate spurring. Chronic but
increased central disc protrusion with annular fissure (series 3,
image 8 and series 6, image 34) since 8438. Moderate facet and
ligament flavum hypertrophy have mildly progressed. Increased
moderate to severe lateral recess stenosis which appears more
pronounced on the left. Increased mild to moderate spinal stenosis.
Mild left L5 foraminal stenosis appears stable.
IMPRESSION: 1. Progressed chronic lower lumbar disc, endplate, and posterior
element degeneration since the 8438 MRI.
2. Increased multifactorial moderate to severe spinal stenosis at
L3-L4 with left lateral recess stenosis and increased left L3
foraminal stenosis.
3. Increased mild to moderate spinal stenosis at L4-L5 with left
lateral recess and new moderate left foraminal stenosis.
4. Enlarged chronic central disc protrusion at L5-S1, with increased
mild to moderate spinal stenosis and moderate to severe left lateral
recess stenosis which appears greater on the left.
5. Regression of a broad-based right foraminal disc protrusion at
L2-L3 since [DATE].

## 2023-04-07 ENCOUNTER — Other Ambulatory Visit (HOSPITAL_COMMUNITY): Payer: Self-pay | Admitting: Neurological Surgery

## 2023-04-07 DIAGNOSIS — M4807 Spinal stenosis, lumbosacral region: Secondary | ICD-10-CM

## 2023-05-02 ENCOUNTER — Ambulatory Visit (HOSPITAL_COMMUNITY)
Admission: RE | Admit: 2023-05-02 | Discharge: 2023-05-02 | Disposition: A | Discharge status: Home / Self Care | Payer: Medicare Other | Source: Ambulatory Visit | Attending: Neurological Surgery | Admitting: Neurological Surgery

## 2023-05-02 DIAGNOSIS — M4807 Spinal stenosis, lumbosacral region: Secondary | ICD-10-CM | POA: Insufficient documentation
